# Patient Record
Sex: Female | Born: 2000 | Race: White | Hispanic: No | Marital: Single | Smoking: Never smoker
Health system: Southern US, Community
[De-identification: ages and names within clinical notes are randomized; demographics above are authoritative.]

## PROBLEM LIST (undated history)

## (undated) HISTORY — PX: KNEE SURGERY: SHX244

---
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
## 2019-08-31 ENCOUNTER — Other Ambulatory Visit: Payer: Self-pay

## 2019-08-31 ENCOUNTER — Ambulatory Visit
Admission: EM | Admit: 2019-08-31 | Discharge: 2019-08-31 | Disposition: A | Discharge status: Home / Self Care | Payer: BC Managed Care – PPO | Attending: Family Medicine | Admitting: Family Medicine

## 2019-08-31 DIAGNOSIS — J0100 Acute maxillary sinusitis, unspecified: Secondary | ICD-10-CM

## 2019-08-31 MED ORDER — AMOXICILLIN 875 MG PO TABS: 875.0000 mg | ORAL_TABLET | Freq: Two times a day (BID) | ORAL | 0 refills | Status: AC

## 2019-08-31 NOTE — ED Triage Notes (Signed)
Patient states that she received the [facility] and [place] vaccine on 04/08. States that since she has been dealing with sinus congestion, pressure, congestion, runny nose, headache, stuffy nose and sore throat with ear pain. Patient states that she takes xyzal daily and flonase and symptoms are typically well controlled.

## 2019-08-31 NOTE — ED Provider Notes (Signed)
[facility]    CSN: [id number] Arrival date & time: 08/31/19  1042      History   Chief Complaint Chief Complaint  Patient presents with  . Facial Pain    HPI Wendy Butler is a 20 y.o. female.   19 yo female with a c/o sinus pressure, sinus headaches, nasal congestion, post nasal drainage and cough for the past 10 days. Denies any fevers, chills, chest pains, shortness of breath.      History reviewed. No pertinent past medical history.  There are no problems to display for this patient.   Past Surgical History:  Procedure Laterality Date  . KNEE SURGERY Right     OB History   No obstetric history on file.      Home Medications    Prior to Admission medications   Medication Sig Start Date End Date Taking? Authorizing Provider  fluticasone (FLONASE) 50 MCG/ACT nasal spray Place 2 sprays into both nostrils daily. 06/15/19  Yes [provider]  INCASSIA 0.35 MG tablet Take 1 tablet by mouth at bedtime. 08/31/19  Yes [provider]  levocetirizine (XYZAL) 5 MG tablet Take by mouth. 04/03/18 06/14/20 Yes [provider]  levonorgestrel (KYLEENA) 19.5 MG IUD by Intrauterine route.   Yes [provider]  omeprazole (PRILOSEC) 20 MG capsule Take 20 mg by mouth daily. 06/15/19  Yes [provider]  sertraline (ZOLOFT) 100 MG tablet Take 100 mg by mouth 2 (two) times daily. 08/31/19  Yes [provider]  sertraline (ZOLOFT) 50 MG tablet Take 50 mg by mouth daily. 06/11/19  Yes [provider]  traZODone (DESYREL) 50 MG tablet TAKE 1 TABLET A NIGHT FOR A WEEK FOR SLEEP AND INCREASE TO 2 TABLETS IF NO IMPROVEMENT. 08/03/19  Yes [provider]  amoxicillin (AMOXIL) 875 MG tablet Take 1 tablet (875 mg total) by mouth 2 (two) times daily. 08/31/19   Payton Mccallum, MD    Family History Family History  Problem Relation Age of Onset  . Healthy Mother   . Healthy Father     Social History Social  History   Tobacco Use  . Smoking status: Never Smoker  . Smokeless tobacco: Never Used  Substance Use Topics  . Alcohol use: Yes    Comment: occasionally  . Drug use: Never     Allergies   Patient has no known allergies.   Review of Systems Review of Systems   Physical Exam Triage Vital Signs ED Triage Vitals  Enc Vitals Group     BP 08/31/19 1103 117/76     Pulse Rate 08/31/19 1103 85     Resp 08/31/19 1103 18     Temp 08/31/19 1103 98.4 F (36.9 C)     Temp Source 08/31/19 1103 Oral     SpO2 08/31/19 1103 100 %     Weight 08/31/19 1100 145 lb (65.8 kg)     Height 08/31/19 1100 5\' 4"  (1.626 m)     Head Circumference --      Peak Flow --      Pain Score 08/31/19 1100 5     Pain Loc --      Pain Edu? --      Excl. in GC? --    No data found.  Updated Vital Signs BP 117/76 (BP Location: Right Arm)   Pulse 85   Temp 98.4 F (36.9 C) (Oral)   Resp 18   Ht 5\' 4"  (1.626 m)   Wt  65.8 kg   SpO2 100%   BMI 24.89 kg/m   Visual Acuity Right Eye Distance:   Left Eye Distance:   Bilateral Distance:    Right Eye Near:   Left Eye Near:    Bilateral Near:     Physical Exam Vitals and nursing note reviewed.  Constitutional:      General: She is not in acute distress.    Appearance: She is not toxic-appearing or diaphoretic.  HENT:     Right Ear: Tympanic membrane normal.     Left Ear: Tympanic membrane normal.     Nose: Congestion present.     Right Sinus: Maxillary sinus tenderness and frontal sinus tenderness present.     Left Sinus: Maxillary sinus tenderness and frontal sinus tenderness present.     Mouth/Throat:     Comments: Postnasal drainage Cardiovascular:     Heart sounds: Normal heart sounds.  Pulmonary:     Effort: Pulmonary effort is normal. No respiratory distress.     Breath sounds: Normal breath sounds.  Neurological:     Mental Status: She is alert.      UC Treatments / Results  Labs (all labs ordered are listed, but only  abnormal results are displayed) Labs Reviewed - No data to display  EKG   Radiology No results found.  Procedures Procedures (including critical care time)  Medications Ordered in UC Medications - No data to display  Initial Impression / Assessment and Plan / UC Course  I have reviewed the triage vital signs and the nursing notes.  Pertinent labs & imaging results that were available during my care of the patient were reviewed by me and considered in my medical decision making (see chart for details).      Final Clinical Impressions(s) / UC Diagnoses   Final diagnoses:  Acute maxillary sinusitis, recurrence not specified    ED Prescriptions    Medication Sig Dispense Auth. Provider   amoxicillin (AMOXIL) 875 MG tablet Take 1 tablet (875 mg total) by mouth 2 (two) times daily. 20 tablet Norval Gable, MD     1. diagnosis reviewed with patient 2. rx as per orders above; reviewed possible side effects, interactions, risks and benefits  3. Recommend supportive treatment with otc steroid nasal spray 4. Follow-up prn if symptoms worsen or don't improve   PDMP not reviewed this encounter.   Norval Gable, MD 08/31/19 1158
